# Patient Record
Sex: Female | Born: 2000 | Race: Black or African American | Hispanic: No | Marital: Single | Smoking: Never smoker
Health system: Southern US, Community
[De-identification: ages and names within clinical notes are randomized; demographics above are authoritative.]

---
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
## 2021-03-14 NOTE — Telephone Encounter (Signed)
NO-SHOW for appointment

## 2023-02-08 IMAGING — CT CT ABD-PELV W/ CM
2 of 4 series · 17 of 46 positions shown, 19 images · IV contrast (omnipaque)
Comparison: None.

CLINICAL DATA: Chronic left-sided abdominal pain.

EXAM:
CT ABDOMEN AND PELVIS WITH CONTRAST
TECHNIQUE: Multidetector CT imaging of the abdomen and pelvis was performed
using the standard protocol following bolus administration of
intravenous contrast.
CONTRAST:  100mL OMNIPAQUE IOHEXOL 300 MG/ML  SOLN

[Series 2: routine abd/pel with · axial · 0.61mm/px · z∈[-258,+132]mm · 14 of 86 slices shown, 16 images]
[im 4/86  soft-tissue]
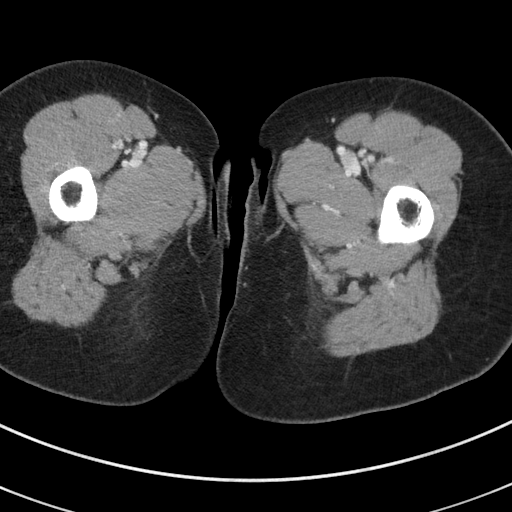
[im 4/86  bone]
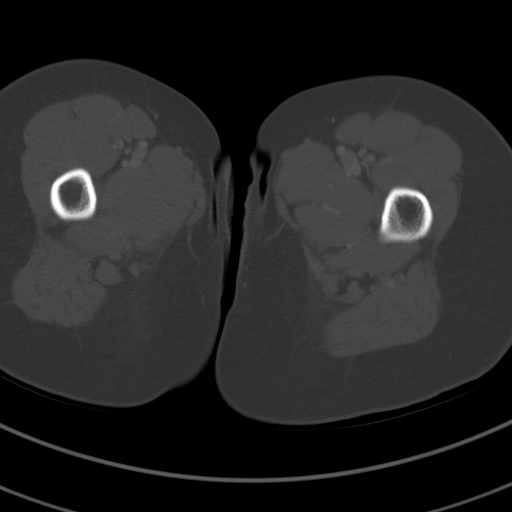
[im 11/86  soft-tissue]
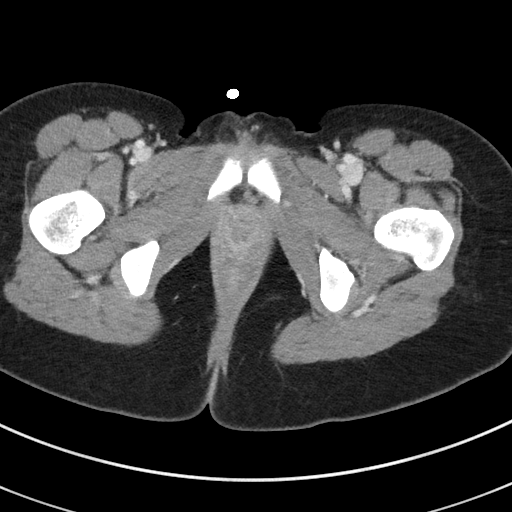
[im 18/86  soft-tissue]
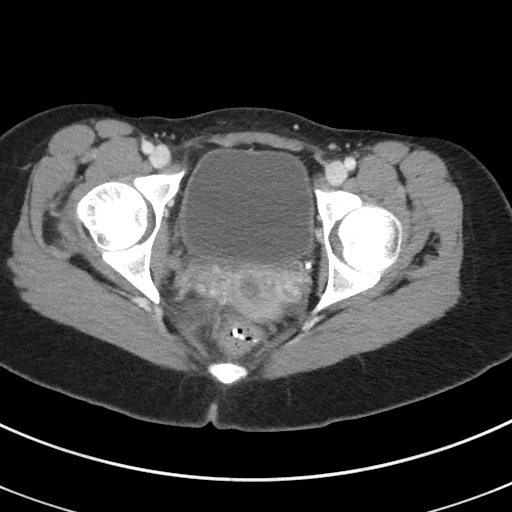
[im 24/86  soft-tissue]
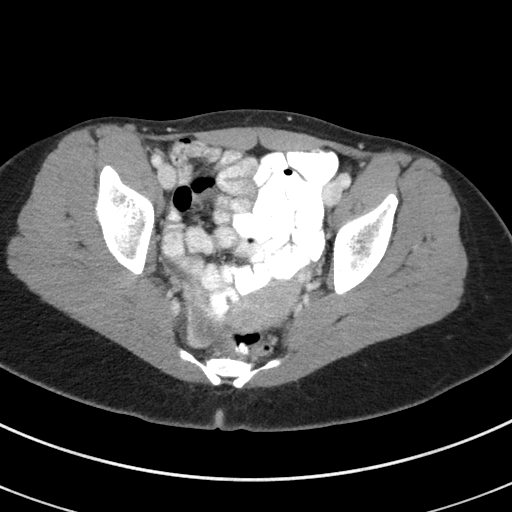
[im 28/86  soft-tissue]
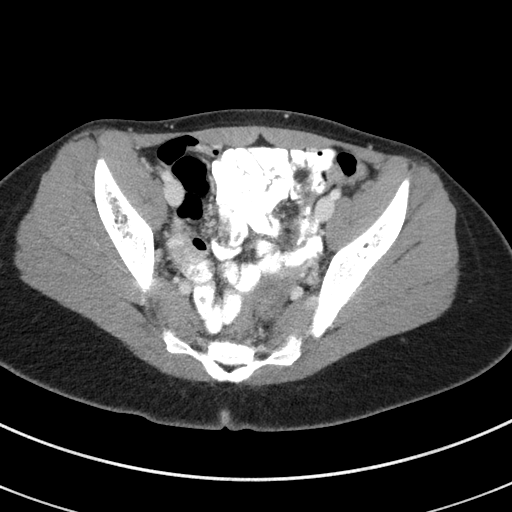
[im 35/86  soft-tissue]
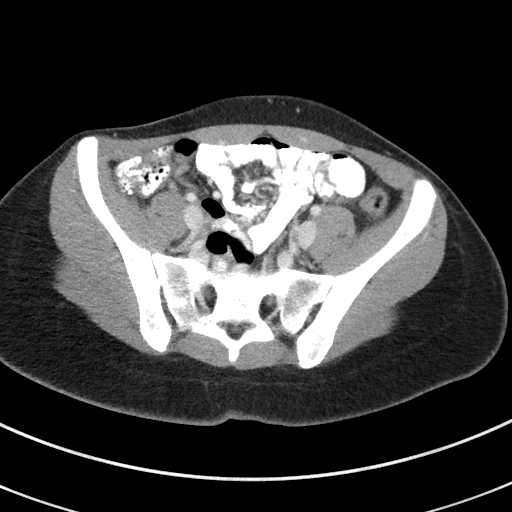
[im 41/86  soft-tissue]
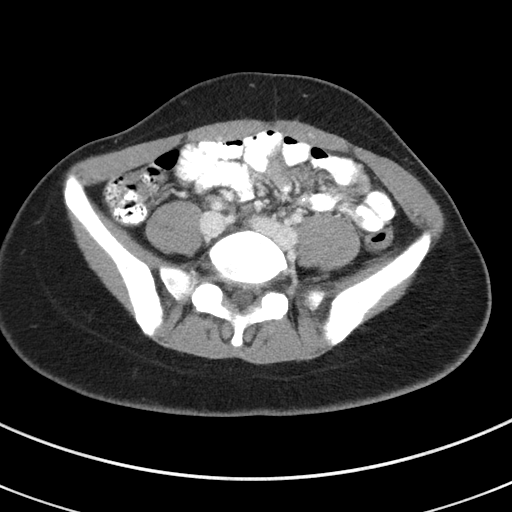
[im 45/86  soft-tissue]
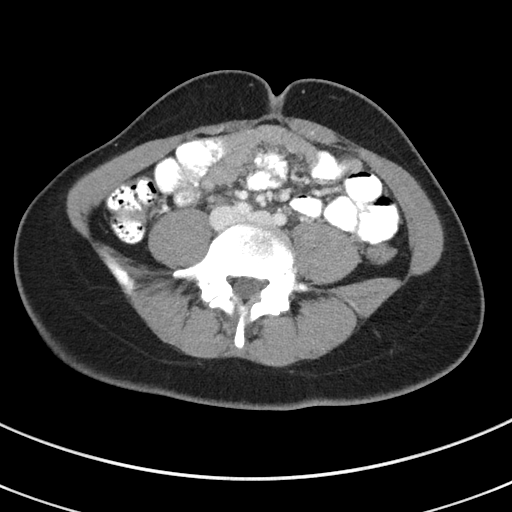
[im 52/86  soft-tissue]
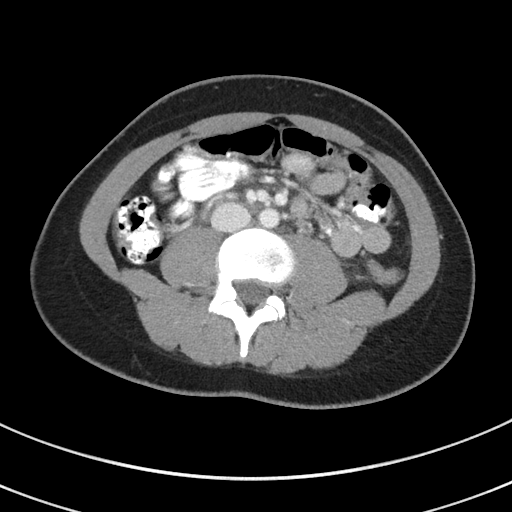
[im 52/86  bone]
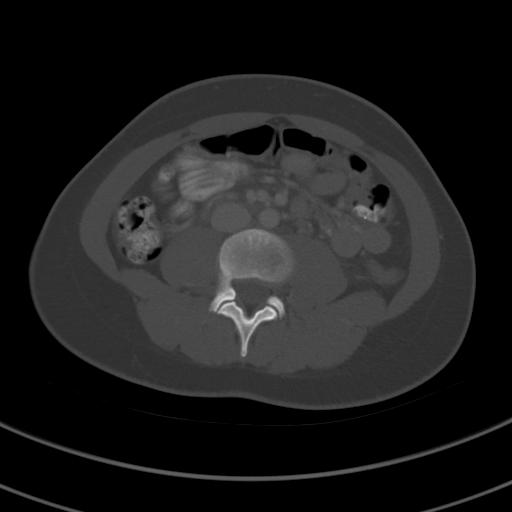
[im 58/86  soft-tissue]
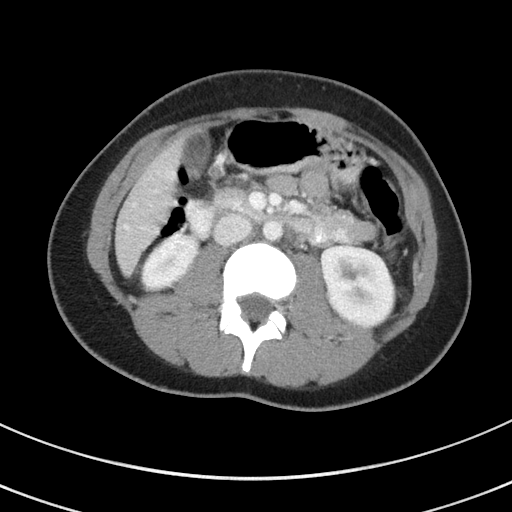
[im 65/86  soft-tissue]
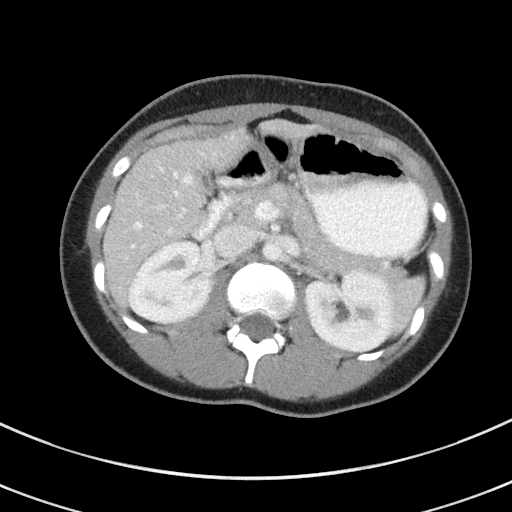
[im 69/86  soft-tissue]
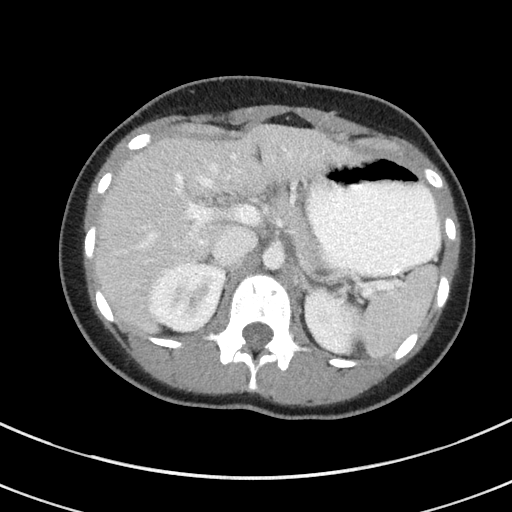
[im 75/86  soft-tissue]
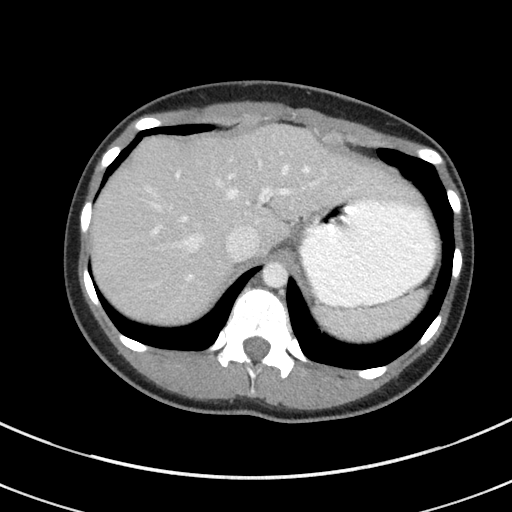
[im 82/86  soft-tissue]
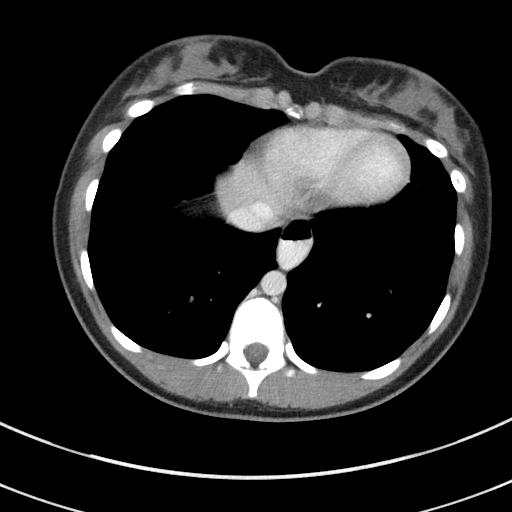

[Series 5: coronal st · coronal · 0.75mm/px · 3 of 78 slices shown]
[im 26/78  soft-tissue]
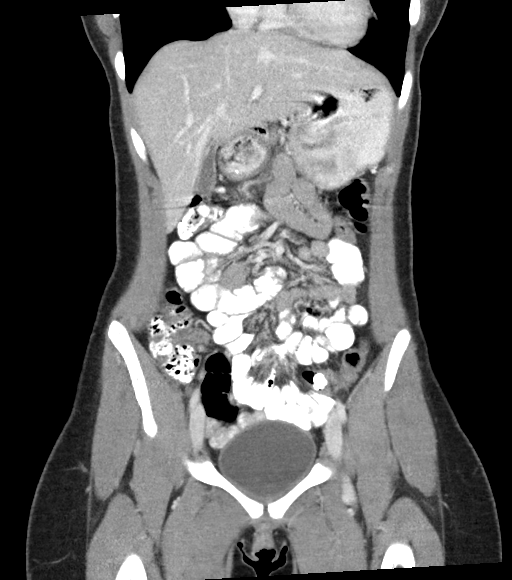
[im 35/78  soft-tissue]
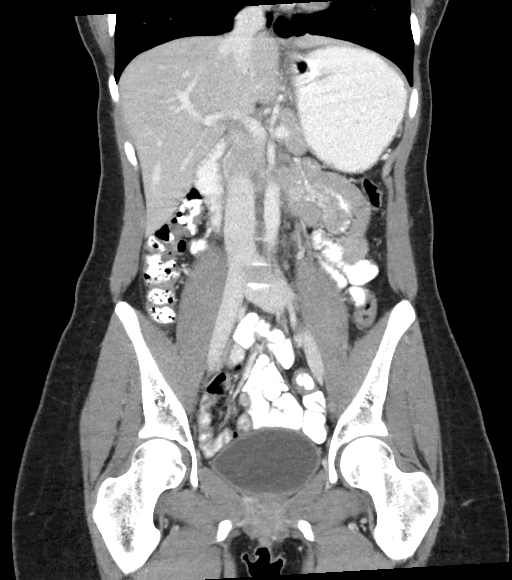
[im 43/78  soft-tissue]
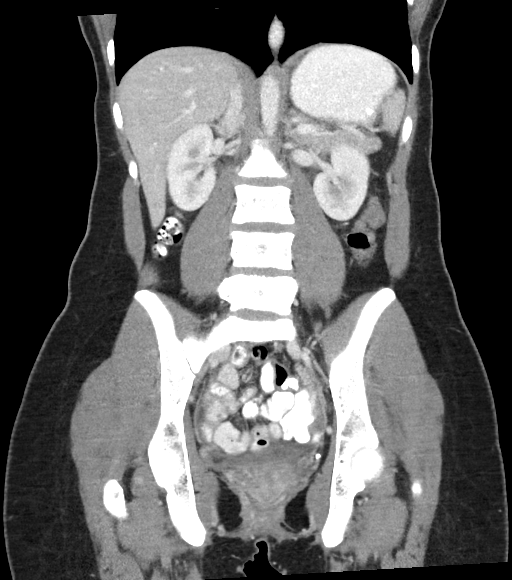

[17 of 46 positions shown; findings below may reference images not displayed]

FINDINGS: Lower chest: No acute abnormality.

Hepatobiliary: No focal liver abnormality is seen. No gallstones,
gallbladder wall thickening, or biliary dilatation.

Pancreas: Unremarkable. No pancreatic ductal dilatation or
surrounding inflammatory changes.

Spleen: Normal in size without focal abnormality.

Adrenals/Urinary Tract: Adrenal glands are unremarkable. Kidneys are
normal, without renal calculi, focal lesion, or hydronephrosis.
Bladder is unremarkable.

Stomach/Bowel: Stomach is within normal limits. Appendix appears
normal. No evidence of bowel wall thickening, distention, or
inflammatory changes.

Vascular/Lymphatic: No significant vascular findings are present. No
enlarged abdominal or pelvic lymph nodes.

Reproductive: Uterus and bilateral adnexa are unremarkable.

Other: No abdominal wall hernia or abnormality. No abdominopelvic
ascites.

Musculoskeletal: No acute or significant osseous findings.
IMPRESSION: No definite abnormality seen in the abdomen or pelvis.
